# Patient Record
Sex: Male | Born: 1985 | Hispanic: No | Marital: Single
Health system: Southern US, Community
[De-identification: ages and names within clinical notes are randomized; demographics above are authoritative.]

---

## 2021-03-10 ENCOUNTER — Emergency Department (HOSPITAL_COMMUNITY)
Admission: EM | Admit: 2021-03-10 | Discharge: 2021-03-10 | Disposition: A | Discharge status: Home / Self Care | Payer: Self-pay | Attending: Emergency Medicine | Admitting: Emergency Medicine

## 2021-03-10 ENCOUNTER — Emergency Department (HOSPITAL_COMMUNITY): Payer: Self-pay

## 2021-03-10 ENCOUNTER — Other Ambulatory Visit: Payer: Self-pay

## 2021-03-10 DIAGNOSIS — S0993XA Unspecified injury of face, initial encounter: Secondary | ICD-10-CM | POA: Diagnosis present

## 2021-03-10 DIAGNOSIS — J439 Emphysema, unspecified: Secondary | ICD-10-CM | POA: Diagnosis not present

## 2021-03-10 DIAGNOSIS — R110 Nausea: Secondary | ICD-10-CM | POA: Insufficient documentation

## 2021-03-10 DIAGNOSIS — S1093XA Contusion of unspecified part of neck, initial encounter: Secondary | ICD-10-CM | POA: Diagnosis not present

## 2021-03-10 DIAGNOSIS — S01112A Laceration without foreign body of left eyelid and periocular area, initial encounter: Secondary | ICD-10-CM | POA: Diagnosis not present

## 2021-03-10 DIAGNOSIS — S40012A Contusion of left shoulder, initial encounter: Secondary | ICD-10-CM | POA: Insufficient documentation

## 2021-03-10 DIAGNOSIS — Y92410 Unspecified street and highway as the place of occurrence of the external cause: Secondary | ICD-10-CM | POA: Diagnosis not present

## 2021-03-10 DIAGNOSIS — S3991XA Unspecified injury of abdomen, initial encounter: Secondary | ICD-10-CM

## 2021-03-10 DIAGNOSIS — S299XXA Unspecified injury of thorax, initial encounter: Secondary | ICD-10-CM

## 2021-03-10 DIAGNOSIS — R1110 Vomiting, unspecified: Secondary | ICD-10-CM | POA: Diagnosis not present

## 2021-03-10 DIAGNOSIS — R519 Headache, unspecified: Secondary | ICD-10-CM | POA: Insufficient documentation

## 2021-03-10 DIAGNOSIS — S0990XA Unspecified injury of head, initial encounter: Secondary | ICD-10-CM | POA: Diagnosis not present

## 2021-03-10 DIAGNOSIS — Y900 Blood alcohol level of less than 20 mg/100 ml: Secondary | ICD-10-CM | POA: Insufficient documentation

## 2021-03-10 DIAGNOSIS — M546 Pain in thoracic spine: Secondary | ICD-10-CM | POA: Diagnosis not present

## 2021-03-10 DIAGNOSIS — S2232XA Fracture of one rib, left side, initial encounter for closed fracture: Secondary | ICD-10-CM

## 2021-03-10 DIAGNOSIS — M7918 Myalgia, other site: Secondary | ICD-10-CM

## 2021-03-10 LAB — SAMPLE TO BLOOD BANK

## 2021-03-10 LAB — COMPREHENSIVE METABOLIC PANEL
ALT: 22 U/L (ref 0–44)
AST: 31 U/L (ref 15–41)
Albumin: 4.3 g/dL (ref 3.5–5.0)
Alkaline Phosphatase: 57 U/L (ref 38–126)
Anion gap: 11 (ref 5–15)
BUN: 15 mg/dL (ref 6–20)
CO2: 26 mmol/L (ref 22–32)
Calcium: 9.5 mg/dL (ref 8.9–10.3)
Chloride: 105 mmol/L (ref 98–111)
Creatinine, Ser: 0.84 mg/dL (ref 0.61–1.24)
GFR, Estimated: >60 mL/min (ref >60)
Glucose, Bld: 99 mg/dL (ref 70–99)
Potassium: 3.4 mmol/L — ABNORMAL LOW (ref 3.5–5.1)
Sodium: 142 mmol/L (ref 135–145)
Total Bilirubin: 1 mg/dL (ref 0.3–1.2)
Total Protein: 7.1 g/dL (ref 6.5–8.1)

## 2021-03-10 LAB — I-STAT CHEM 8, ED
BUN: 17 mg/dL (ref 6–20)
Calcium, Ion: 1.18 mmol/L (ref 1.15–1.40)
Chloride: 106 mmol/L (ref 98–111)
Creatinine, Ser: 0.8 mg/dL (ref 0.61–1.24)
Glucose, Bld: 100 mg/dL — ABNORMAL HIGH (ref 70–99)
HCT: 42 % (ref 39.0–52.0)
Hemoglobin: 14.3 g/dL (ref 13.0–17.0)
Potassium: 3.3 mmol/L — ABNORMAL LOW (ref 3.5–5.1)
Sodium: 144 mmol/L (ref 135–145)
TCO2: 26 mmol/L (ref 22–32)

## 2021-03-10 LAB — CBC
HCT: 40.3 % (ref 39.0–52.0)
Hemoglobin: 13.4 g/dL (ref 13.0–17.0)
MCH: 31.3 pg (ref 26.0–34.0)
MCHC: 33.3 g/dL (ref 30.0–36.0)
MCV: 94.2 fL (ref 80.0–100.0)
Platelets: 213 10*3/uL (ref 150–400)
RBC: 4.28 MIL/uL (ref 4.22–5.81)
RDW: 13.3 % (ref 11.5–15.5)
WBC: 16.2 10*3/uL — ABNORMAL HIGH (ref 4.0–10.5)
nRBC: 0 % (ref 0.0–0.2)

## 2021-03-10 LAB — ETHANOL: Alcohol, Ethyl (B): <10 mg/dL (ref <10)

## 2021-03-10 LAB — LACTIC ACID, PLASMA: Lactic Acid, Venous: 0.7 mmol/L (ref 0.5–1.9)

## 2021-03-10 LAB — PROTIME-INR
INR: 1 (ref 0.8–1.2)
Prothrombin Time: 13.4 seconds (ref 11.4–15.2)

## 2021-03-10 MED ORDER — IBUPROFEN 600 MG PO TABS: 600.0000 mg | ORAL_TABLET | Freq: Four times a day (QID) | ORAL | 0 refills | Status: DC | PRN

## 2021-03-10 MED ORDER — CYCLOBENZAPRINE HCL 10 MG PO TABS: 10.0000 mg | ORAL_TABLET | Freq: Two times a day (BID) | ORAL | 0 refills | Status: DC | PRN

## 2021-03-10 MED ORDER — IOHEXOL 300 MG/ML  SOLN
100.0000 mL | Freq: Once | INTRAMUSCULAR | Status: AC | PRN
  Administered 2021-03-10: 100 mL via INTRAVENOUS

## 2021-03-10 MED ORDER — ONDANSETRON 4 MG PO TBDP
4.0000 mg | ORAL_TABLET | Freq: Once | ORAL | Status: AC
  Administered 2021-03-10: 4 mg via ORAL
  Filled 2021-03-10: qty 1

## 2021-03-10 MED ORDER — OXYCODONE-ACETAMINOPHEN 5-325 MG PO TABS: 1.0000 | ORAL_TABLET | Freq: Four times a day (QID) | ORAL | 0 refills | Status: DC | PRN

## 2021-03-10 MED ORDER — MORPHINE SULFATE (PF) 4 MG/ML IV SOLN
4.0000 mg | Freq: Once | INTRAVENOUS | Status: AC
  Administered 2021-03-10: 4 mg via INTRAVENOUS
  Filled 2021-03-10: qty 1

## 2021-03-10 NOTE — Discharge Instructions (Addendum)
Cool compresses to the sore areas. Take medications as prescribed.   Return to the ED with any new or concerning symptoms. Specifically, any sharp severe pain of the left side neck with any swelling.
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
## 2021-03-10 NOTE — ED Notes (Signed)
Patient transported to CT 
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
## 2021-03-10 NOTE — ED Notes (Signed)
Collar removed by provider

## 2022-03-01 ENCOUNTER — Emergency Department (HOSPITAL_COMMUNITY): Payer: Commercial Managed Care - PPO

## 2022-03-01 ENCOUNTER — Encounter (HOSPITAL_COMMUNITY): Payer: Self-pay

## 2022-03-01 ENCOUNTER — Emergency Department (HOSPITAL_COMMUNITY)
Admission: EM | Admit: 2022-03-01 | Discharge: 2022-03-01 | Disposition: A | Discharge status: Home / Self Care | Payer: Commercial Managed Care - PPO | Attending: Emergency Medicine | Admitting: Emergency Medicine

## 2022-03-01 DIAGNOSIS — Q676 Pectus excavatum: Secondary | ICD-10-CM | POA: Insufficient documentation

## 2022-03-01 DIAGNOSIS — R079 Chest pain, unspecified: Secondary | ICD-10-CM | POA: Diagnosis present

## 2022-03-01 DIAGNOSIS — R072 Precordial pain: Secondary | ICD-10-CM | POA: Insufficient documentation

## 2022-03-01 DIAGNOSIS — R9431 Abnormal electrocardiogram [ECG] [EKG]: Secondary | ICD-10-CM | POA: Insufficient documentation

## 2022-03-01 DIAGNOSIS — D72829 Elevated white blood cell count, unspecified: Secondary | ICD-10-CM | POA: Diagnosis not present

## 2022-03-01 LAB — COMPREHENSIVE METABOLIC PANEL
ALT: 16 U/L (ref 0–44)
AST: 22 U/L (ref 15–41)
Albumin: 4.5 g/dL (ref 3.5–5.0)
Alkaline Phosphatase: 54 U/L (ref 38–126)
Anion gap: 9 (ref 5–15)
BUN: 12 mg/dL (ref 6–20)
CO2: 24 mmol/L (ref 22–32)
Calcium: 9.8 mg/dL (ref 8.9–10.3)
Chloride: 107 mmol/L (ref 98–111)
Creatinine, Ser: 0.84 mg/dL (ref 0.61–1.24)
GFR, Estimated: >60 mL/min (ref >60)
Glucose, Bld: 89 mg/dL (ref 70–99)
Potassium: 3.9 mmol/L (ref 3.5–5.1)
Sodium: 140 mmol/L (ref 135–145)
Total Bilirubin: 1.9 mg/dL — ABNORMAL HIGH (ref 0.3–1.2)
Total Protein: 7.6 g/dL (ref 6.5–8.1)

## 2022-03-01 LAB — CBC
HCT: 44.3 % (ref 39.0–52.0)
Hemoglobin: 14.8 g/dL (ref 13.0–17.0)
MCH: 31.1 pg (ref 26.0–34.0)
MCHC: 33.4 g/dL (ref 30.0–36.0)
MCV: 93.1 fL (ref 80.0–100.0)
Platelets: 224 10*3/uL (ref 150–400)
RBC: 4.76 MIL/uL (ref 4.22–5.81)
RDW: 13.1 % (ref 11.5–15.5)
WBC: 11.1 10*3/uL — ABNORMAL HIGH (ref 4.0–10.5)
nRBC: 0 % (ref 0.0–0.2)

## 2022-03-01 LAB — TROPONIN I (HIGH SENSITIVITY): Troponin I (High Sensitivity): 2 ng/L (ref <18)

## 2022-03-01 LAB — LIPASE, BLOOD: Lipase: 26 U/L (ref 11–51)

## 2022-03-01 MED ORDER — MELOXICAM 7.5 MG PO TABS: 7.5000 mg | ORAL_TABLET | Freq: Every day | ORAL | 0 refills | Status: DC

## 2022-03-01 NOTE — ED Provider Notes (Signed)
[facility]-EMERGENCY DEPT Provider Note   CSN: [id number] Arrival date & time: 03/01/22  1248     History  Chief Complaint  Patient presents with   Chest Pain   Abdominal Pain    Michael Sawyer. is a 36 y.o. male.  Patient with history of pectus excavated him presents to the emergency department for evaluation of chest pains that have been occurring over the past 2 days.  Patient describes intermittent short-lived episodes of sharp stabbing pain in the left mid chest lasting for several minutes before resolving.  He states that the sensations are painful.  No associated nausea, vomiting, diaphoresis.  They were more frequent this morning, prompting emergency department visit.  He did feel lightheaded with a spell today, but no full syncope.  Symptoms are not triggered or worsened by eating or drinking.  Also not worsened by movement or palpation of the chest wall.  Patient states that he has been anxious and under a lot of stress.  He was encouraged by his family to get checked as well.  Patient denies risk factors for pulmonary embolism including: unilateral leg swelling, history of DVT/PE/other blood clots, use of exogenous hormones, recent immobilizations, recent surgery, recent travel (>4hr segment), malignancy, hemoptysis.  He did have a 3-hour flight to [place] in the end of June, but no subsequent leg pain or swelling.         Home Medications Prior to Admission medications   Medication Sig Start Date End Date Taking? Authorizing Provider  meloxicam (MOBIC) 7.5 MG tablet Take 1 tablet (7.5 mg total) by mouth daily. 03/01/22  Yes Renne Crigler, PA-C      Allergies    Penicillins    Review of Systems   Review of Systems  Physical Exam Updated Vital Signs BP (!) 128/98   Pulse 60   Temp 98 F (36.7 C) (Oral)   Resp 18   SpO2 98%  Physical Exam Vitals and nursing note reviewed.  Constitutional:      Appearance: He is well-developed. He is  not diaphoretic.  HENT:     Head: Normocephalic and atraumatic.     Mouth/Throat:     Mouth: Mucous membranes are not dry.  Eyes:     Conjunctiva/sclera: Conjunctivae normal.  Neck:     Vascular: Normal carotid pulses. No carotid bruit or JVD.     Trachea: Trachea normal. No tracheal deviation.  Cardiovascular:     Rate and Rhythm: Normal rate and regular rhythm.     Pulses: No decreased pulses.          Radial pulses are 2+ on the right side and 2+ on the left side.     Heart sounds: Normal heart sounds, S1 normal and S2 normal. Heart sounds not distant. No murmur heard. Pulmonary:     Effort: Pulmonary effort is normal. No respiratory distress.     Breath sounds: Normal breath sounds. No wheezing.  Chest:     Chest wall: No tenderness.     Comments: Pectus excavated him noted.  No tenderness to palpation over the chest or sternal areas.  No skin findings or rashes. Abdominal:     General: Bowel sounds are normal.     Palpations: Abdomen is soft.     Tenderness: There is no abdominal tenderness. There is no guarding or rebound.  Musculoskeletal:     Cervical back: Normal range of motion and neck supple. No muscular tenderness.     Right lower  leg: No edema.     Left lower leg: No edema.  Skin:    General: Skin is warm and dry.     Coloration: Skin is not pale.  Neurological:     Mental Status: He is alert. Mental status is at baseline.  Psychiatric:        Mood and Affect: Mood normal.     ED Results / Procedures / Treatments   Labs (all labs ordered are listed, but only abnormal results are displayed) Labs Reviewed  CBC - Abnormal; Notable for the following components:      Result Value   WBC 11.1 (*)    All other components within normal limits  COMPREHENSIVE METABOLIC PANEL - Abnormal; Notable for the following components:   Total Bilirubin 1.9 (*)    All other components within normal limits  LIPASE, BLOOD  TROPONIN I (HIGH SENSITIVITY)  TROPONIN I (HIGH  SENSITIVITY)    ED ECG REPORT   Date: 03/01/2022  Rate: 84  Rhythm: normal sinus rhythm  QRS Axis: right  Intervals: normal  ST/T Wave abnormalities: nonspecific T wave changes  Conduction Disutrbances:none  Narrative Interpretation:   Old EKG Reviewed: none available  I have personally reviewed the EKG tracing and agree with the computerized printout as noted.   Radiology DG Chest 2 View  Result Date: 03/01/2022 CLINICAL DATA:  chest pain and left epigastric pain EXAM: CHEST - 2 VIEW COMPARISON:  None Available. FINDINGS: The heart size and mediastinal contours are within normal limits. Both lungs are hyperinflated, clear and stable. The visualized skeletal structures are unremarkable. IMPRESSION: No active cardiopulmonary disease. Electronically Signed   By: Marjo Bicker M.D.   On: 03/01/2022 13:55    Procedures Procedures    Medications Ordered in ED Medications - No data to display  ED Course/ Medical Decision Making/ A&P    Patient seen and examined. History obtained directly from patient. Work-up including labs, imaging, EKG ordered in triage, if performed, were reviewed.    Labs/EKG: Independently reviewed and interpreted.  This included: CBC with minimally elevated white blood cell count at 11.1 otherwise unremarkable; CMP unremarkable; lipase normal; troponin 2; EKG interpreted as above.  Imaging: Independently reviewed and interpreted.  This included: Chest x-ray, agree negative.  Medications/Fluids: None ordered. Most recent vital signs reviewed and are as follows: BP (!) 128/98   Pulse 60   Temp 98 F (36.7 C) (Oral)   Resp 18   SpO2 98%   Initial impression: Atypical chest pain  Home treatment plan: Trial of meloxicam, rest.  Return and follow-up instructions: I encouraged patient to return to ED with severe chest pain, especially if the pain is crushing or pressure-like and spreads to the arms, back, neck, or jaw, or if they have associated sweating,  vomiting, or shortness of breath with the pain, or significant pain with activity. We discussed that the evaluation here today indicates a low-risk of serious cause of chest pain, including heart trouble or a blood clot, but no evaluation is perfect and chest pain can evolve with time. The patient verbalized understanding and agreed.  I encouraged patient to follow-up with their provider in the next 48 hours for recheck.  I have also provided a cardiology referral.                           Medical Decision Making Risk Prescription drug management.   For this patient's complaint of chest pain, the following  emergent conditions were considered on the differential diagnosis: acute coronary syndrome, pulmonary embolism, pneumothorax, myocarditis, pericardial tamponade, aortic dissection, thoracic aortic aneurysm complication, esophageal perforation.   Other causes were also considered including: gastroesophageal reflux disease, musculoskeletal pain including costochondritis, pneumonia/pleurisy, herpes zoster, pericarditis.  In regards to possibility of ACS, patient has atypical features of pain, non-ischemic and unchanged EKG and negative troponin(s). Heart score was calculated to be 1.   In regards to possibility of PE, symptoms are atypical for PE and patient is PERC negative and chance of PE < 2%.   The patient's vital signs, pertinent lab work and imaging were reviewed and interpreted as discussed in the ED course. Hospitalization was considered for further testing, treatments, or serial exams/observation. However as patient is well-appearing, has a stable exam, and reassuring studies today, I do not feel that they warrant admission at this time. This plan was discussed with the patient who verbalizes agreement and comfort with this plan and seems reliable and able to return to the Emergency Department with worsening or changing symptoms.          Final Clinical Impression(s) / ED  Diagnoses Final diagnoses:  Precordial pain  Abnormal EKG  Pectus excavatum    Rx / DC Orders ED Discharge Orders          Ordered    meloxicam (MOBIC) 7.5 MG tablet  Daily        03/01/22 1908    Ambulatory referral to Cardiology       Comments: If you have not heard from the Cardiology office within the next 72 hours please call [phone].   03/01/22 1908              Carlisle Cater, PA-C 03/01/22 1916    Drenda Freeze, MD 03/01/22 2328

## 2022-03-01 NOTE — ED Provider Triage Note (Signed)
Emergency Medicine Provider Triage Evaluation Note  Michael Sawyer. , a 36 y.o. male  was evaluated in triage.  Pt complains of chest pain. States that same has been ongoing for the past 2 days, is left sided in nature and does not radiate. He states that he has felt similar pain in the past and has been told it is due to his pectus excavatum. Denies any shortness of breath. Pain is not pleuritis or exertional. Denies any leg pain or leg swelling. Also endorses a few episodes of nausea and vomiting with the last being prior to arrival.  Review of Systems  Positive:  Negative:   Physical Exam  BP (!) 127/93 (BP Location: Left Arm)   Pulse 82   Temp 98.5 F (36.9 C) (Oral)   Resp 18   SpO2 100%  Gen:   Awake, no distress   Resp:  Normal effort  MSK:   Moves extremities without difficulty  Other:    Medical Decision Making  Medically screening exam initiated at 1:25 PM.  Appropriate orders placed.  Michael Quest. was informed that the remainder of the evaluation will be completed by another provider, this initial triage assessment does not replace that evaluation, and the importance of remaining in the ED until their evaluation is complete.     Silva Bandy, PA-C 03/01/22 1327

## 2022-03-01 NOTE — Discharge Instructions (Signed)
Please read and follow all provided instructions.  Your diagnoses today include:  1. Precordial pain   2. Abnormal EKG   3. Pectus excavatum     Tests performed today include: An EKG of your heart A chest x-ray Cardiac enzymes - a blood test for heart muscle damage Blood counts and electrolytes Vital signs. See below for your results today.   Medications prescribed:  Meloxicam - anti-inflammatory pain medication  You have been prescribed an anti-inflammatory medication or NSAID. Take with food. Do not take aspirin, ibuprofen, or naproxen if taking this medication. Take smallest effective dose for the shortest duration needed for your pain. Stop taking if you experience stomach pain or vomiting.   Take any prescribed medications only as directed.  Follow-up instructions: Please follow-up with your primary care provider or the cardiology referral for further evaluation of your symptoms.   Return instructions:  SEEK IMMEDIATE MEDICAL ATTENTION IF: You have severe chest pain, especially if the pain is crushing or pressure-like and spreads to the arms, back, neck, or jaw, or if you have sweating, nausea or vomiting, or trouble with breathing. THIS IS AN EMERGENCY. Do not wait to see if the pain will go away. Get medical help at once. Call 911. DO NOT drive yourself to the hospital.  Your chest pain gets worse and does not go away after a few minutes of rest.  You have an attack of chest pain lasting longer than what you usually experience.  You have significant dizziness, if you pass out, or have trouble walking.  You have chest pain not typical of your usual pain for which you originally saw your caregiver.  You have any other emergent concerns regarding your health.  Additional Information: Chest pain comes from many different causes. Your caregiver has diagnosed you as having chest pain that is not specific for one problem, but does not require admission.  You are at low risk for an  acute heart condition or other serious illness.   Your vital signs today were: BP (!) 128/98   Pulse 60   Temp 98 F (36.7 C) (Oral)   Resp 18   SpO2 98%  If your blood pressure (BP) was elevated above 135/85 this visit, please have this repeated by your doctor within one month. --------------

## 2022-03-01 NOTE — ED Triage Notes (Signed)
Pt states he began having L sided Cp and epigastric pain yesterday. Pt also c/o N/V.

## 2022-03-07 ENCOUNTER — Ambulatory Visit (INDEPENDENT_AMBULATORY_CARE_PROVIDER_SITE_OTHER): Payer: Commercial Managed Care - PPO | Admitting: Internal Medicine

## 2022-03-07 VITALS — BP 104/72 | HR 68 | Ht 73.0 in | Wt 143.0 lb

## 2022-03-07 DIAGNOSIS — R9431 Abnormal electrocardiogram [ECG] [EKG]: Secondary | ICD-10-CM

## 2022-03-07 NOTE — Patient Instructions (Signed)
Medication Instructions:  No Changes In Medications at this time.  *If you need a refill on your cardiac medications before your next appointment, please call your pharmacy*  Lab Work: None Ordered At This Time.  If you have labs (blood work) drawn today and your tests are completely normal, you will receive your results only by: MyChart Message (if you have MyChart) OR A paper copy in the mail If you have any lab test that is abnormal or we need to change your treatment, we will call you to review the results.  Testing/Procedures: Your physician has requested that you have an echocardiogram. Echocardiography is a painless test that uses sound waves to create images of your heart. It provides your doctor with information about the size and shape of your heart and how well your heart's chambers and valves are working. You may receive an ultrasound enhancing agent through an IV if needed to better visualize your heart during the echo.This procedure takes approximately one hour. There are no restrictions for this procedure. This will take place at the [address].   Follow-Up: At [facility], you and your health needs are our priority.  As part of our continuing mission to provide you with exceptional heart care, we have created designated Provider Care Teams.  These Care Teams include your primary Cardiologist (physician) and Advanced Practice Providers (APPs -  Physician Assistants and Nurse Practitioners) who all work together to provide you with the care you need, when you need it.  Your next appointment:   6 month(s)  The format for your next appointment:   In Person  Provider:   Maisie Fus, MD

## 2022-03-07 NOTE — Progress Notes (Signed)
Cardiology Office Note:    Date:  03/07/2022   ID:  Michael Quest., DOB 01/28/1986, MRN [id number]  PCP:  Aviva Kluver   [facility] Providers Cardiologist:  Maisie Fus, MD     Referring MD: Renne Crigler, PA-C   No chief complaint on file. Pleuritic CP  History of Present Illness:    Michael Soth. is a 36 y.o. male with a hx of pectus excavatum, he went to the ED a few days ago and noted sharp left chest pain that lasted for several minutes.  He has been anxious and has had a lot of stress. EKG shows RVH.  He states he feels a tightness. It felt heavy.  He states this has been lifelong.    Current Medications: Current Meds  Medication Sig   meloxicam (MOBIC) 7.5 MG tablet Take 1 tablet (7.5 mg total) by mouth daily.     Allergies:   Penicillins   Social History   Socioeconomic History   Marital status: Single    Spouse name: Not on file   Number of children: Not on file   Years of education: Not on file   Highest education level: Not on file  Occupational History   Not on file  Tobacco Use   Smoking status: Not on file   Smokeless tobacco: Not on file  Substance and Sexual Activity   Alcohol use: Not on file   Drug use: Not on file   Sexual activity: Not on file  Other Topics Concern   Not on file  Social History Narrative   Not on file   Social Determinants of Health   Financial Resource Strain: Not on file  Food Insecurity: Not on file  Transportation Needs: Not on file  Physical Activity: Not on file  Stress: Not on file  Social Connections: Not on file     Family History: GF with heart disease  ROS:   Please see the history of present illness.     All other systems reviewed and are negative.  EKGs/Labs/Other Studies Reviewed:    The following studies were reviewed today:   EKG:  EKG is  ordered today.  The ekg ordered today demonstrates   03/07/2022-NSR, RVH  Recent Labs: 03/01/2022: ALT 16; BUN 12; Creatinine, Ser 0.84;  Hemoglobin 14.8; Platelets 224; Potassium 3.9; Sodium 140   Recent Lipid Panel No results found for: "CHOL", "TRIG", "HDL", "CHOLHDL", "VLDL", "LDLCALC", "LDLDIRECT"   Risk Assessment/Calculations:           Physical Exam:    VS:  BP 104/72   Pulse 68   Ht 6\' 1"  (1.854 m)   Wt 143 lb (64.9 kg)   SpO2 98%   BMI 18.87 kg/m     Wt Readings from Last 3 Encounters:  03/07/22 143 lb (64.9 kg)     GEN:  Well nourished, well developed in no acute distress HEENT: Normal NECK: No JVD; No carotid bruits LYMPHATICS: No lymphadenopathy CARDIAC: RRR, no murmurs, rubs, gallops RESPIRATORY:  Clear to auscultation without rales, wheezing or rhonchi  ABDOMEN: Soft, non-tender, non-distended MUSCULOSKELETAL:  pectus excavatum SKIN: Warm and dry NEUROLOGIC:  Alert and oriented x 3 PSYCHIATRIC:  Normal affect   ASSESSMENT:    Pleuritic CP:  No signs of PE/DVT. Will plan for TTE to considering pectus is associated with congential heart dx. With RVH can assess for PS. PLAN:    In order of problems listed above:  TTE Follow up 6 months  Medication Adjustments/Labs and Tests Ordered: Current medicines are reviewed at length with the patient today.  Concerns regarding medicines are outlined above.  Orders Placed This Encounter  Procedures   EKG 12-Lead   ECHOCARDIOGRAM COMPLETE   No orders of the defined types were placed in this encounter.   Patient Instructions  Medication Instructions:  No Changes In Medications at this time.  *If you need a refill on your cardiac medications before your next appointment, please call your pharmacy*  Lab Work: None Ordered At This Time.  If you have labs (blood work) drawn today and your tests are completely normal, you will receive your results only by: MyChart Message (if you have MyChart) OR A paper copy in the mail If you have any lab test that is abnormal or we need to change your treatment, we will call you to review the  results.  Testing/Procedures: Your physician has requested that you have an echocardiogram. Echocardiography is a painless test that uses sound waves to create images of your heart. It provides your doctor with information about the size and shape of your heart and how well your heart's chambers and valves are working. You may receive an ultrasound enhancing agent through an IV if needed to better visualize your heart during the echo.This procedure takes approximately one hour. There are no restrictions for this procedure. This will take place at the [address].   Follow-Up: At [facility], you and your health needs are our priority.  As part of our continuing mission to provide you with exceptional heart care, we have created designated Provider Care Teams.  These Care Teams include your primary Cardiologist (physician) and Advanced Practice Providers (APPs -  Physician Assistants and Nurse Practitioners) who all work together to provide you with the care you need, when you need it.  Your next appointment:   6 month(s)  The format for your next appointment:   In Person  Provider:   Maisie Fus, MD        Signed, Maisie Fus, MD  03/07/2022 11:52 AM    [facility]

## 2022-03-18 ENCOUNTER — Encounter (HOSPITAL_COMMUNITY): Payer: Self-pay | Admitting: Emergency Medicine

## 2022-03-18 ENCOUNTER — Emergency Department (HOSPITAL_COMMUNITY)
Admission: EM | Admit: 2022-03-18 | Discharge: 2022-03-18 | Disposition: A | Discharge status: Home / Self Care | Payer: Commercial Managed Care - PPO | Attending: Emergency Medicine | Admitting: Emergency Medicine

## 2022-03-18 ENCOUNTER — Emergency Department (HOSPITAL_COMMUNITY): Payer: Commercial Managed Care - PPO

## 2022-03-18 DIAGNOSIS — S0181XA Laceration without foreign body of other part of head, initial encounter: Secondary | ICD-10-CM

## 2022-03-18 DIAGNOSIS — R519 Headache, unspecified: Secondary | ICD-10-CM | POA: Diagnosis not present

## 2022-03-18 DIAGNOSIS — S01112A Laceration without foreign body of left eyelid and periocular area, initial encounter: Secondary | ICD-10-CM | POA: Insufficient documentation

## 2022-03-18 DIAGNOSIS — S0993XA Unspecified injury of face, initial encounter: Secondary | ICD-10-CM | POA: Diagnosis present

## 2022-03-18 MED ORDER — BACITRACIN ZINC 500 UNIT/GM EX OINT: TOPICAL_OINTMENT | Freq: Two times a day (BID) | CUTANEOUS | Status: DC

## 2022-03-18 MED ORDER — LIDOCAINE-EPINEPHRINE (PF) 2 %-1:200000 IJ SOLN
10.0000 mL | Freq: Once | INTRAMUSCULAR | Status: AC
  Administered 2022-03-18: 10 mL via INTRADERMAL
  Filled 2022-03-18: qty 20

## 2022-03-18 NOTE — ED Notes (Signed)
Pt ambulated to CT
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
## 2022-03-24 ENCOUNTER — Ambulatory Visit (HOSPITAL_COMMUNITY): Payer: Commercial Managed Care - PPO | Attending: Internal Medicine

## 2022-03-24 DIAGNOSIS — R9431 Abnormal electrocardiogram [ECG] [EKG]: Secondary | ICD-10-CM | POA: Diagnosis present

## 2022-03-24 LAB — ECHOCARDIOGRAM COMPLETE
Area-P 1/2: 3.91 cm2
S' Lateral: 3.5 cm

## 2022-04-26 ENCOUNTER — Other Ambulatory Visit: Payer: Self-pay

## 2022-04-26 ENCOUNTER — Emergency Department (HOSPITAL_COMMUNITY): Payer: Commercial Managed Care - PPO

## 2022-04-26 ENCOUNTER — Emergency Department (HOSPITAL_COMMUNITY)
Admission: EM | Admit: 2022-04-26 | Discharge: 2022-04-26 | Disposition: A | Discharge status: Home / Self Care | Payer: Commercial Managed Care - PPO | Attending: Emergency Medicine | Admitting: Emergency Medicine

## 2022-04-26 ENCOUNTER — Encounter (HOSPITAL_COMMUNITY): Payer: Self-pay

## 2022-04-26 DIAGNOSIS — M542 Cervicalgia: Secondary | ICD-10-CM | POA: Insufficient documentation

## 2022-04-26 DIAGNOSIS — M79631 Pain in right forearm: Secondary | ICD-10-CM | POA: Insufficient documentation

## 2022-04-26 DIAGNOSIS — S060X0A Concussion without loss of consciousness, initial encounter: Secondary | ICD-10-CM

## 2022-04-26 DIAGNOSIS — M25562 Pain in left knee: Secondary | ICD-10-CM | POA: Diagnosis not present

## 2022-04-26 DIAGNOSIS — M62830 Muscle spasm of back: Secondary | ICD-10-CM | POA: Diagnosis not present

## 2022-04-26 DIAGNOSIS — Y92410 Unspecified street and highway as the place of occurrence of the external cause: Secondary | ICD-10-CM | POA: Diagnosis not present

## 2022-04-26 DIAGNOSIS — M25462 Effusion, left knee: Secondary | ICD-10-CM | POA: Insufficient documentation

## 2022-04-26 DIAGNOSIS — R0789 Other chest pain: Secondary | ICD-10-CM | POA: Diagnosis not present

## 2022-04-26 DIAGNOSIS — S0990XA Unspecified injury of head, initial encounter: Secondary | ICD-10-CM | POA: Diagnosis present

## 2022-04-26 DIAGNOSIS — S0083XA Contusion of other part of head, initial encounter: Secondary | ICD-10-CM | POA: Diagnosis not present

## 2022-04-26 LAB — ETHANOL: Alcohol, Ethyl (B): <10 mg/dL (ref <10)

## 2022-04-26 LAB — COMPREHENSIVE METABOLIC PANEL
ALT: 20 U/L (ref 0–44)
AST: 24 U/L (ref 15–41)
Albumin: 4.2 g/dL (ref 3.5–5.0)
Alkaline Phosphatase: 58 U/L (ref 38–126)
Anion gap: 7 (ref 5–15)
BUN: 13 mg/dL (ref 6–20)
CO2: 25 mmol/L (ref 22–32)
Calcium: 8.7 mg/dL — ABNORMAL LOW (ref 8.9–10.3)
Chloride: 109 mmol/L (ref 98–111)
Creatinine, Ser: 0.63 mg/dL (ref 0.61–1.24)
GFR, Estimated: >60 mL/min (ref >60)
Glucose, Bld: 90 mg/dL (ref 70–99)
Potassium: 3.8 mmol/L (ref 3.5–5.1)
Sodium: 141 mmol/L (ref 135–145)
Total Bilirubin: 0.5 mg/dL (ref 0.3–1.2)
Total Protein: 7.2 g/dL (ref 6.5–8.1)

## 2022-04-26 LAB — CBC
HCT: 39.7 % (ref 39.0–52.0)
Hemoglobin: 13.3 g/dL (ref 13.0–17.0)
MCH: 31.5 pg (ref 26.0–34.0)
MCHC: 33.5 g/dL (ref 30.0–36.0)
MCV: 94.1 fL (ref 80.0–100.0)
Platelets: 219 10*3/uL (ref 150–400)
RBC: 4.22 MIL/uL (ref 4.22–5.81)
RDW: 13.2 % (ref 11.5–15.5)
WBC: 15.8 10*3/uL — ABNORMAL HIGH (ref 4.0–10.5)
nRBC: 0 % (ref 0.0–0.2)

## 2022-04-26 LAB — PROTIME-INR
INR: 1 (ref 0.8–1.2)
Prothrombin Time: 12.8 seconds (ref 11.4–15.2)

## 2022-04-26 LAB — URINALYSIS, ROUTINE W REFLEX MICROSCOPIC
Bilirubin Urine: NEGATIVE
Glucose, UA: NEGATIVE mg/dL
Hgb urine dipstick: NEGATIVE
Ketones, ur: NEGATIVE mg/dL
Leukocytes,Ua: NEGATIVE
Nitrite: NEGATIVE
Protein, ur: NEGATIVE mg/dL
Specific Gravity, Urine: 1.011 (ref 1.005–1.030)
pH: 5 (ref 5.0–8.0)

## 2022-04-26 LAB — RAPID URINE DRUG SCREEN, HOSP PERFORMED
Amphetamines: NOT DETECTED
Barbiturates: NOT DETECTED
Benzodiazepines: NOT DETECTED
Cocaine: NOT DETECTED
Opiates: NOT DETECTED
Tetrahydrocannabinol: POSITIVE — AB

## 2022-04-26 LAB — SAMPLE TO BLOOD BANK

## 2022-04-26 MED ORDER — PROCHLORPERAZINE EDISYLATE 10 MG/2ML IJ SOLN
5.0000 mg | Freq: Once | INTRAMUSCULAR | Status: AC
  Administered 2022-04-26: 5 mg via INTRAVENOUS
  Filled 2022-04-26: qty 2

## 2022-04-26 MED ORDER — SODIUM CHLORIDE (PF) 0.9 % IJ SOLN
INTRAMUSCULAR | Status: AC
  Filled 2022-04-26: qty 50

## 2022-04-26 MED ORDER — KETOROLAC TROMETHAMINE 30 MG/ML IJ SOLN
30.0000 mg | Freq: Once | INTRAMUSCULAR | Status: AC
  Administered 2022-04-26: 30 mg via INTRAVENOUS
  Filled 2022-04-26: qty 1

## 2022-04-26 MED ORDER — IOHEXOL 300 MG/ML  SOLN
100.0000 mL | Freq: Once | INTRAMUSCULAR | Status: AC | PRN
  Administered 2022-04-26: 100 mL via INTRAVENOUS

## 2022-04-26 MED ORDER — NAPROXEN 375 MG PO TABS
375.0000 mg | ORAL_TABLET | Freq: Two times a day (BID) | ORAL | 0 refills | Status: DC
Start: 2022-04-26 — End: 2023-03-09

## 2022-04-26 MED ORDER — MECLIZINE HCL 25 MG PO TABS
25.0000 mg | ORAL_TABLET | Freq: Three times a day (TID) | ORAL | 0 refills | Status: AC | PRN
Start: 2022-04-26 — End: ?
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
## 2022-05-19 ENCOUNTER — Other Ambulatory Visit: Payer: Self-pay

## 2022-05-19 ENCOUNTER — Emergency Department (HOSPITAL_COMMUNITY)
Admission: EM | Admit: 2022-05-19 | Discharge: 2022-05-19 | Disposition: A | Discharge status: Home / Self Care | Payer: Commercial Managed Care - PPO | Attending: Emergency Medicine | Admitting: Emergency Medicine

## 2022-05-19 ENCOUNTER — Emergency Department (HOSPITAL_COMMUNITY): Payer: Commercial Managed Care - PPO

## 2022-05-19 DIAGNOSIS — R109 Unspecified abdominal pain: Secondary | ICD-10-CM | POA: Diagnosis not present

## 2022-05-19 DIAGNOSIS — M549 Dorsalgia, unspecified: Secondary | ICD-10-CM | POA: Insufficient documentation

## 2022-05-19 MED ORDER — ACETAMINOPHEN 500 MG PO TABS
1000.0000 mg | ORAL_TABLET | Freq: Once | ORAL | Status: AC
  Administered 2022-05-19: 1000 mg via ORAL
  Filled 2022-05-19: qty 2

## 2022-05-19 MED ORDER — IBUPROFEN 600 MG PO TABS: 600.0000 mg | ORAL_TABLET | Freq: Four times a day (QID) | ORAL | 0 refills | Status: AC | PRN
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
## 2022-05-19 NOTE — Discharge Instructions (Addendum)
We evaluated you for your back pain.  Your chest x-ray did not show a collapsed lung or pneumonia. Please take Tylenol and Motrin for your symptoms at home.  You can take 650 mg of Tylenol every 6 hours and 600 mg of ibuprofen every 6 hours as needed for your symptoms.  You can take these medicines together as needed, either at the same time, or alternating every 3 hours.   Please return to the emergency department if you develop any new or worsening symptoms, such as trouble breathing, fainting, lightheadedness or dizziness, worsening pain, or any other new symptoms.

## 2022-07-25 ENCOUNTER — Emergency Department (HOSPITAL_BASED_OUTPATIENT_CLINIC_OR_DEPARTMENT_OTHER)
Admission: EM | Admit: 2022-07-25 | Discharge: 2022-07-25 | Disposition: A | Discharge status: Home / Self Care | Payer: Commercial Managed Care - PPO | Attending: Emergency Medicine | Admitting: Emergency Medicine

## 2022-07-25 ENCOUNTER — Other Ambulatory Visit: Payer: Self-pay

## 2022-07-25 ENCOUNTER — Encounter (HOSPITAL_BASED_OUTPATIENT_CLINIC_OR_DEPARTMENT_OTHER): Payer: Self-pay | Admitting: Emergency Medicine

## 2022-07-25 DIAGNOSIS — R369 Urethral discharge, unspecified: Secondary | ICD-10-CM | POA: Diagnosis present

## 2022-07-25 DIAGNOSIS — Z202 Contact with and (suspected) exposure to infections with a predominantly sexual mode of transmission: Secondary | ICD-10-CM | POA: Diagnosis not present

## 2022-07-25 DIAGNOSIS — R300 Dysuria: Secondary | ICD-10-CM

## 2022-07-25 LAB — URINALYSIS, ROUTINE W REFLEX MICROSCOPIC
Bilirubin Urine: NEGATIVE
Glucose, UA: NEGATIVE mg/dL
Hgb urine dipstick: NEGATIVE
Ketones, ur: NEGATIVE mg/dL
Nitrite: NEGATIVE
Protein, ur: NEGATIVE mg/dL
Specific Gravity, Urine: 1.02 (ref 1.005–1.030)
WBC, UA: >50 WBC/hpf — ABNORMAL HIGH (ref 0–5)
pH: 7.5 (ref 5.0–8.0)

## 2022-07-25 LAB — HIV ANTIBODY (ROUTINE TESTING W REFLEX): HIV Screen 4th Generation wRfx: NONREACTIVE

## 2022-07-25 MED ORDER — DOXYCYCLINE HYCLATE 100 MG PO CAPS: 100.0000 mg | ORAL_CAPSULE | Freq: Two times a day (BID) | ORAL | 0 refills | Status: DC

## 2022-07-25 MED ORDER — CEFTRIAXONE SODIUM 500 MG IJ SOLR
500.0000 mg | Freq: Once | INTRAMUSCULAR | Status: AC
  Administered 2022-07-25: 500 mg via INTRAMUSCULAR
  Filled 2022-07-25: qty 500

## 2022-07-25 MED ORDER — DOXYCYCLINE HYCLATE 100 MG PO TABS
100.0000 mg | ORAL_TABLET | Freq: Once | ORAL | Status: AC
  Administered 2022-07-25: 100 mg via ORAL
  Filled 2022-07-25: qty 1

## 2022-07-25 NOTE — ED Triage Notes (Signed)
Pt arrives to ED with c/o penile discharge and dysuria x2 days.

## 2022-07-25 NOTE — ED Provider Notes (Signed)
[facility] EMERGENCY DEPT Provider Note   CSN: [id number] Arrival date & time: 07/25/22  1405     History  Chief Complaint  Patient presents with   Penile Discharge    Michael Sawyer. is a 36 y.o. male.   Penile Discharge   36 year old male presents emergency department complaints of penile discharge and dysuria for the past 2 to 3 days.  Reports recent new sexual partner and is concerned about possible STD transmission of which she is requesting to be tested for everything.  Denies any testicular pain, noticeable rash/sore.  He states he only has pain when urinating.  He notices white, cloudy like discharge that has been present since symptom onset.  Denies fever, chills, night sweats, chest pain, shortness of breath, abdominal pain, nausea, vomiting, change in bowel habits.  No significant past medical history. Home Medications Prior to Admission medications   Medication Sig Start Date End Date Taking? Authorizing Provider  doxycycline (VIBRAMYCIN) 100 MG capsule Take 1 capsule (100 mg total) by mouth 2 (two) times daily. 07/25/22  Yes Dion Saucier A, PA  ibuprofen (ADVIL) 600 MG tablet Take 1 tablet (600 mg total) by mouth every 6 (six) hours as needed. 05/19/22   Cristie Hem, MD  meclizine (ANTIVERT) 25 MG tablet Take 1 tablet (25 mg total) by mouth 3 (three) times daily as needed for dizziness. 04/26/22   Margarita Mail, PA-C  meloxicam (MOBIC) 7.5 MG tablet Take 1 tablet (7.5 mg total) by mouth daily. Patient not taking: Reported on 04/26/2022 03/01/22   Carlisle Cater, PA-C  naproxen (NAPROSYN) 375 MG tablet Take 1 tablet (375 mg total) by mouth 2 (two) times daily with a meal. 04/26/22   Margarita Mail, PA-C      Allergies    Penicillins    Review of Systems   Review of Systems  Genitourinary:  Positive for penile discharge.  All other systems reviewed and are negative.   Physical Exam Updated Vital Signs BP 125/85 (BP Location: Right Arm)    Pulse 80   Temp 98.2 F (36.8 C) (Oral)   Resp 16   Ht 6\' 1"  (1.854 m)   Wt 64.4 kg   SpO2 99%   BMI 18.73 kg/m  Physical Exam Vitals and nursing note reviewed. Exam conducted with a chaperone present.  Constitutional:      General: He is not in acute distress.    Appearance: He is well-developed.  HENT:     Head: Normocephalic and atraumatic.  Eyes:     Conjunctiva/sclera: Conjunctivae normal.  Cardiovascular:     Rate and Rhythm: Normal rate and regular rhythm.     Heart sounds: No murmur heard. Pulmonary:     Effort: Pulmonary effort is normal. No respiratory distress.     Breath sounds: Normal breath sounds.  Abdominal:     Palpations: Abdomen is soft.     Tenderness: There is no abdominal tenderness.  Genitourinary:    Pubic Area: No rash.      Penis: Circumcised. Discharge present.      Testes: Normal.        Right: Mass, tenderness or swelling not present.        Left: Mass, tenderness or swelling not present.     Epididymis:     Right: Normal.     Left: Normal.     Comments: White/cloudy discharge noted from ureteral meatus. Musculoskeletal:        General: No swelling.     Cervical  back: Neck supple.  Skin:    General: Skin is warm and dry.     Capillary Refill: Capillary refill takes less than 2 seconds.  Neurological:     Mental Status: He is alert.  Psychiatric:        Mood and Affect: Mood normal.     ED Results / Procedures / Treatments   Labs (all labs ordered are listed, but only abnormal results are displayed) Labs Reviewed  URINALYSIS, ROUTINE W REFLEX MICROSCOPIC - Abnormal; Notable for the following components:      Result Value   APPearance HAZY (*)    Leukocytes,Ua LARGE (*)    WBC, UA >50 (*)    Bacteria, UA RARE (*)    All other components within normal limits  RPR  HIV ANTIBODY (ROUTINE TESTING W REFLEX)  GC/CHLAMYDIA PROBE AMP ([facility]) NOT AT [facility]    EKG None  Radiology No results found.  Procedures Procedures     Medications Ordered in ED Medications  doxycycline (VIBRA-TABS) tablet 100 mg (has no administration in time range)  cefTRIAXone (ROCEPHIN) injection 500 mg (has no administration in time range)    ED Course/ Medical Decision Making/ A&P                           Medical Decision Making Amount and/or Complexity of Data Reviewed Labs: ordered.  Risk Prescription drug management.   This patient presents to the ED for concern of penile discharge/dysuria, this involves an extensive number of treatment options, and is a complaint that carries with it a high risk of complications and morbidity.  The differential diagnosis includes urinary tract infection, pyelonephritis, epididymitis, Fitz-Hugh Curtis syndrome, gonorrhea, chlamydia, syphilis, HIV   Co morbidities that complicate the patient evaluation  See HPI   Additional history obtained:  Additional history obtained from EMR External records from outside source obtained and reviewed including hospital records   Lab Tests:  I Ordered, and personally interpreted labs.  The pertinent results include: HIV, RPR pending.  GC chlamydia pending.  UA significant for large leukocyte, greater than 50 WBC with rare bacteria..   Imaging Studies ordered:  N/a   Cardiac Monitoring: / EKG:  The patient was maintained on a cardiac monitor.  I personally viewed and interpreted the cardiac monitored which showed an underlying rhythm of: Sinus rhythm   Consultations Obtained:  N/a   Problem List / ED Course / Critical interventions / Medication management  STD exposure/dysuria I ordered medication including Rocephin and doxycycline for empiric STD antibiotic coverage.   Reevaluation of the patient after these medicines showed that the patient stayed the same I have reviewed the patients home medicines and have made adjustments as needed   Social Determinants of Health:  Denies illicit drug use.   Test / Admission -  Considered:  STD exposure/dysuria Vitals signs within normal range and stable throughout visit. Laboratory/imaging studies significant for: See above Given patient's history of present illness as well as physical exam findings, most concerning for chlamydial/gonorrhea urethritis.  Treated empirically with Rocephin as well as doxycycline while in the emergency department.  Continue treatment with doxycycline to be administered outpatient.  Patient recommended to follow MyChart for results of RPR and HIV tests as well as follow-up with primary care in 5 to 7 days.  He is also educated regarding sexual abstinence until antibiotic course has completed as well as to alert sexual partners of presumed diagnosis so they can  seek treatment themselves..  Treatment plan discussed length with patient and he is not understanding was agreeable to said plan. Worrisome signs and symptoms were discussed with the patient, and the patient acknowledged understanding to return to the ED if noticed. Patient was stable upon discharge.          Final Clinical Impression(s) / ED Diagnoses Final diagnoses:  STD exposure  Dysuria    Rx / DC Orders ED Discharge Orders          Ordered    doxycycline (VIBRAMYCIN) 100 MG capsule  2 times daily        07/25/22 1528              Wilnette Kales, [place] 07/25/22 1532    Elgie Congo, MD 07/26/22 1223

## 2022-07-25 NOTE — ED Notes (Signed)
Patient verbalizes understanding of discharge instructions. Opportunity for questioning and answers were provided. Patient discharged from ED.  °

## 2022-07-25 NOTE — Discharge Instructions (Signed)
Noted to be treated today empirically for both gonorrhea and chlamydia.  For continue chlamydia coverage, continue take antibiotic pill as directed for the next 7 days.  Given your current symptoms, most likely infection of one of the other.  We have also tested for HIV as well as syphilis.  Follow results on MyChart.  Recommendation is to abstain from sexual intercourse until antibiotic therapy has been completed.  Also it would be in your partner's best interest to let them know of your current visit so they can seek treatment as well.  Follow-up with primary care recommended in 5 to 7 days.  Please not hesitate to return to emergency department for worrisome signs and symptoms we discussed become apparent.

## 2022-07-26 LAB — GC/CHLAMYDIA PROBE AMP (~~LOC~~) NOT AT ARMC
Chlamydia: NEGATIVE
Comment: NEGATIVE
Comment: NORMAL
Neisseria Gonorrhea: POSITIVE — AB

## 2022-07-26 LAB — RPR: RPR Ser Ql: NONREACTIVE

## 2022-08-25 ENCOUNTER — Ambulatory Visit: Payer: Commercial Managed Care - PPO | Attending: Internal Medicine | Admitting: Internal Medicine

## 2023-03-09 ENCOUNTER — Emergency Department (HOSPITAL_COMMUNITY)
Admission: EM | Admit: 2023-03-09 | Discharge: 2023-03-09 | Disposition: A | Discharge status: Home / Self Care | Payer: Commercial Managed Care - PPO | Attending: Emergency Medicine | Admitting: Emergency Medicine

## 2023-03-09 ENCOUNTER — Encounter (HOSPITAL_COMMUNITY): Payer: Self-pay | Admitting: Emergency Medicine

## 2023-03-09 DIAGNOSIS — K0889 Other specified disorders of teeth and supporting structures: Secondary | ICD-10-CM | POA: Diagnosis present

## 2023-03-09 DIAGNOSIS — K047 Periapical abscess without sinus: Secondary | ICD-10-CM | POA: Diagnosis not present

## 2023-03-09 MED ORDER — AMOXICILLIN-POT CLAVULANATE 875-125 MG PO TABS
1.0000 | ORAL_TABLET | Freq: Once | ORAL | Status: AC
  Administered 2023-03-09: 1 via ORAL

## 2023-03-09 MED ORDER — NAPROXEN 375 MG PO TABS: 375.0000 mg | ORAL_TABLET | Freq: Two times a day (BID) | ORAL | 0 refills | Status: AC

## 2023-03-09 MED ORDER — AMOXICILLIN-POT CLAVULANATE 875-125 MG PO TABS: 1.0000 | ORAL_TABLET | Freq: Two times a day (BID) | ORAL | 0 refills | Status: AC

## 2023-03-09 NOTE — ED Triage Notes (Signed)
Pt has swelling x 2 days to lower R tooth/jaw. Denies pain and has tried to call his dentist at [facility]. Took some amoxicillin friend had.

## 2023-03-09 NOTE — ED Provider Notes (Signed)
[place] EMERGENCY DEPARTMENT AT [facility] Provider Note  CSN: [id number] Arrival date & time: 03/09/23 1191  Chief Complaint(s) Oral Swelling  HPI Michael Sawyer. is a 37 y.o. male    The history is provided by the patient.  Dental Pain Location:  Lower Quality:  Constant Severity:  Moderate Onset quality:  Gradual Duration:  2 days Timing:  Constant Progression:  Worsening Chronicity:  New Context: abscess, dental fracture and poor dentition   Associated symptoms: facial pain and gum swelling   Associated symptoms: no drooling, no fever, no neck swelling and no oral lesions    Started taking a friend's amoxicillin for the last 2 days.  Past Medical History History reviewed. No pertinent past medical history. There are no problems to display for this patient.  Home Medication(s) Prior to Admission medications   Medication Sig Start Date End Date Taking? Authorizing Provider  amoxicillin-clavulanate (AUGMENTIN) 875-125 MG tablet Take 1 tablet by mouth every 12 (twelve) hours for 10 days. 03/09/23 03/19/23 Yes Erol Flanagin, Amadeo Garnet, MD  ibuprofen (ADVIL) 600 MG tablet Take 1 tablet (600 mg total) by mouth every 6 (six) hours as needed. 05/19/22   Lonell Grandchild, MD  meclizine (ANTIVERT) 25 MG tablet Take 1 tablet (25 mg total) by mouth 3 (three) times daily as needed for dizziness. 04/26/22   Arthor Captain, PA-C  naproxen (NAPROSYN) 375 MG tablet Take 1 tablet (375 mg total) by mouth 2 (two) times daily with a meal. 03/09/23   Aslee Such, Amadeo Garnet, MD                                                                                                                                    Allergies Penicillins  Review of Systems Review of Systems  Constitutional:  Negative for fever.  HENT:  Negative for drooling and mouth sores.    As noted in HPI  Physical Exam Vital Signs  I have reviewed the triage vital signs BP 125/82 (BP Location: Right Arm)    Pulse 89   Temp 97.7 F (36.5 C) (Oral)   Resp 18   SpO2 97%   Physical Exam Vitals reviewed.  Constitutional:      General: He is not in acute distress.    Appearance: He is well-developed. He is not diaphoretic.  HENT:     Head: Normocephalic and atraumatic.     Right Ear: External ear normal.     Left Ear: External ear normal.     Nose: Nose normal.     Mouth/Throat:     Mouth: Mucous membranes are moist. No oral lesions.     Dentition: Abnormal dentition. Dental abscesses present.     Pharynx: Oropharynx is clear.     Tonsils: No tonsillar exudate.  Eyes:     General: No scleral icterus.    Conjunctiva/sclera: Conjunctivae normal.  Neck:     Trachea: Phonation normal.  Cardiovascular:     Rate and Rhythm: Normal rate and regular rhythm.  Pulmonary:     Effort: Pulmonary effort is normal. No respiratory distress.     Breath sounds: No stridor.  Abdominal:     General: There is no distension.  Musculoskeletal:        General: Normal range of motion.     Cervical back: Normal range of motion.  Neurological:     Mental Status: He is alert and oriented to person, place, and time.  Psychiatric:        Behavior: Behavior normal.     ED Results and Treatments Labs (all labs ordered are listed, but only abnormal results are displayed) Labs Reviewed - No data to display                                                                                                                       EKG  EKG Interpretation Date/Time:    Ventricular Rate:    PR Interval:    QRS Duration:    QT Interval:    QTC Calculation:   R Axis:      Text Interpretation:         Radiology No results found.  Medications Ordered in ED Medications  amoxicillin-clavulanate (AUGMENTIN) 875-125 MG per tablet 1 tablet (1 tablet Oral Given 03/09/23 0513)   Procedures .Marland KitchenIncision and Drainage  Date/Time: 03/09/2023 7:17 AM  Performed by: Nira Conn, MD Authorized by: Nira Conn, MD   Consent:    Consent obtained:  Verbal   Consent given by:  Patient   Risks discussed:  Bleeding, incomplete drainage and infection   Alternatives discussed:  Delayed treatment Universal protocol:    Patient identity confirmed:  Arm band Location:    Type:  Abscess   Location:  Mouth   Mouth location:  Alveolar process Anesthesia:    Anesthesia method:  Nerve block   Block needle gauge:  27 G   Block anesthetic:  Bupivacaine 0.5% WITH epi   Block technique:  Inferior alveolar   Block injection procedure:  Anatomic landmarks identified, introduced needle, incremental injection, negative aspiration for blood and anatomic landmarks palpated   Block outcome:  Anesthesia achieved Procedure type:    Complexity:  Simple Procedure details:    Needle aspiration: yes     Needle size:  18 G   Drainage:  Purulent   (including critical care time) Medical Decision Making / ED Course   Medical Decision Making   DDX considered listed below.  Dental abscess. No ludwigs or other deep tissue infection noted requiring imaging. Aspirated approx 1.5 cc of purulence. Augmentin given and Rx'd     Final Clinical Impression(s) / ED Diagnoses Final diagnoses:  Dental abscess   The patient appears reasonably screened and/or stabilized for discharge and I doubt any other medical condition or other [facility] requiring further screening, evaluation, or treatment in the ED at this time. I have discussed the findings, Dx and  Tx plan with the patient/family who expressed understanding and agree(s) with the plan. Discharge instructions discussed at length. The patient/family was given strict return precautions who verbalized understanding of the instructions. No further questions at time of discharge.  Disposition: Discharge  Condition: Good  ED Discharge Orders          Ordered    naproxen (NAPROSYN) 375 MG tablet  2 times daily with meals        03/09/23 0504     amoxicillin-clavulanate (AUGMENTIN) 875-125 MG tablet  Every 12 hours        03/09/23 0504             Follow Up: Dentist       This chart was dictated using voice recognition software.  Despite best efforts to proofread,  errors can occur which can change the documentation meaning.    Nira Conn, MD 03/09/23 [phone]
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
## 2023-12-21 ENCOUNTER — Encounter (HOSPITAL_BASED_OUTPATIENT_CLINIC_OR_DEPARTMENT_OTHER): Payer: Self-pay | Admitting: Emergency Medicine

## 2023-12-21 ENCOUNTER — Other Ambulatory Visit: Payer: Self-pay

## 2023-12-21 ENCOUNTER — Emergency Department (HOSPITAL_BASED_OUTPATIENT_CLINIC_OR_DEPARTMENT_OTHER)
Admission: EM | Admit: 2023-12-21 | Discharge: 2023-12-21 | Disposition: A | Discharge status: Home / Self Care | Payer: Self-pay | Attending: Emergency Medicine | Admitting: Emergency Medicine

## 2023-12-21 DIAGNOSIS — Z202 Contact with and (suspected) exposure to infections with a predominantly sexual mode of transmission: Secondary | ICD-10-CM | POA: Insufficient documentation

## 2023-12-21 LAB — HIV ANTIBODY (ROUTINE TESTING W REFLEX): HIV Screen 4th Generation wRfx: NONREACTIVE

## 2023-12-21 MED ORDER — LIDOCAINE HCL (PF) 1 % IJ SOLN
1.0000 mL | Freq: Once | INTRAMUSCULAR | Status: AC
  Administered 2023-12-21: 1 mL
  Filled 2023-12-21: qty 5

## 2023-12-21 MED ORDER — CEFTRIAXONE SODIUM 500 MG IJ SOLR
500.0000 mg | Freq: Once | INTRAMUSCULAR | Status: AC
  Administered 2023-12-21: 500 mg via INTRAMUSCULAR
  Filled 2023-12-21: qty 500

## 2023-12-21 MED ORDER — AZITHROMYCIN 250 MG PO TABS
1000.0000 mg | ORAL_TABLET | Freq: Once | ORAL | Status: AC
  Administered 2023-12-21: 1000 mg via ORAL
  Filled 2023-12-21: qty 4

## 2023-12-21 NOTE — Discharge Instructions (Addendum)
 You are tested for STDs here in the emergency department.  We treated you as if you were positive.  Your results will crossover to MyChart.  You can follow-up there typically takes about 2 days to come back.  If your HIV or syphilis test is positive you will need further outpatient follow-up.  You will need to use protection for 10 days after treatment for you and your partner.

## 2023-12-21 NOTE — ED Triage Notes (Signed)
 Pt reports his SO just let him know she was positive for gonorrhea.

## 2023-12-21 NOTE — ED Provider Notes (Signed)
 [place] EMERGENCY DEPARTMENT AT [facility] Provider Note   CSN: [id number] Arrival date & time: 12/21/23  1610     History  Chief Complaint  Patient presents with   Exposure to STD    Michael Sawyer. is a 38 y.o. male here for STD check.  He denies any symptoms.  Significant other just called him and said you tested positive for gonorrhea.  He would like treatment for "everything" and testing for everything as well.  No rashes, lesions, discharge, dysuria or fever.  HPI     Home Medications Prior to Admission medications   Medication Sig Start Date End Date Taking? Authorizing Provider  ibuprofen  (ADVIL ) 600 MG tablet Take 1 tablet (600 mg total) by mouth every 6 (six) hours as needed. 05/19/22   Mordecai Applebaum, MD  meclizine  (ANTIVERT ) 25 MG tablet Take 1 tablet (25 mg total) by mouth 3 (three) times daily as needed for dizziness. 04/26/22   Harris, Abigail, PA-C  naproxen  (NAPROSYN ) 375 MG tablet Take 1 tablet (375 mg total) by mouth 2 (two) times daily with a meal. 03/09/23   Cardama, Camila Cecil, MD      Allergies    Penicillins    Review of Systems   Review of Systems  Constitutional: Negative.   HENT: Negative.    Respiratory: Negative.    Cardiovascular: Negative.   Gastrointestinal: Negative.   Genitourinary: Negative.   Musculoskeletal: Negative.   Skin: Negative.   Neurological: Negative.   All other systems reviewed and are negative.   Physical Exam Updated Vital Signs BP 124/83   Pulse (!) 102   Temp 98.1 F (36.7 C) (Oral)   Resp 16   SpO2 99%  Physical Exam Vitals and nursing note reviewed.  Constitutional:      General: He is not in acute distress.    Appearance: He is well-developed. He is not ill-appearing, toxic-appearing or diaphoretic.  HENT:     Head: Atraumatic.  Eyes:     Pupils: Pupils are equal, round, and reactive to light.  Cardiovascular:     Rate and Rhythm: Normal rate and regular rhythm.     Pulses:  Normal pulses.     Heart sounds: Normal heart sounds.  Pulmonary:     Effort: Pulmonary effort is normal. No respiratory distress.  Abdominal:     General: There is no distension.     Palpations: Abdomen is soft.  Genitourinary:    Comments: declined Musculoskeletal:        General: Normal range of motion.     Cervical back: Normal range of motion and neck supple.  Skin:    General: Skin is warm and dry.  Neurological:     General: No focal deficit present.     Mental Status: He is alert and oriented to person, place, and time.    ED Results / Procedures / Treatments   Labs (all labs ordered are listed, but only abnormal results are displayed) Labs Reviewed  RPR  HIV ANTIBODY (ROUTINE TESTING W REFLEX)  GC/CHLAMYDIA PROBE AMP ([facility]) NOT AT [facility]    EKG None  Radiology No results found.  Procedures Procedures    Medications Ordered in ED Medications  cefTRIAXone  (ROCEPHIN ) injection 500 mg (has no administration in time range)  lidocaine  (PF) (XYLOCAINE ) 1 % injection 1-2.1 mL (has no administration in time range)  azithromycin  (ZITHROMAX ) tablet 1,000 mg (has no administration in time range)   ED Course/ Medical Decision Making/ A&P  38 year old here for evaluation of STD exposure.  Known exposure to gonorrhea.  He is currently asymptomatic.  Will treat as if he is positive for GC and chlamydia.  He wiill follow-up with results on MyChart.  The patient has been appropriately medically screened and/or stabilized in the ED. I have low suspicion for any other emergent medical condition which would require further screening, evaluation or treatment in the ED or require inpatient management.  Patient is hemodynamically stable and in no acute distress.  Patient able to ambulate in department prior to ED.  Evaluation does not show acute pathology that would require ongoing or additional emergent interventions while in the emergency department or further inpatient  treatment.  I have discussed the diagnosis with the patient and answered all questions.  Pain is been managed while in the emergency department and patient has no further complaints prior to discharge.  Patient is comfortable with plan discussed in room and is stable for discharge at this time.  I have discussed strict return precautions for returning to the emergency department.  Patient was encouraged to follow-up with PCP/specialist refer to at discharge.                                 Medical Decision Making Amount and/or Complexity of Data Reviewed External Data Reviewed: labs and notes. Labs: ordered. Decision-making details documented in ED Course.  Risk OTC drugs. Prescription drug management. Decision regarding hospitalization. Diagnosis or treatment significantly limited by social determinants of health.           Final Clinical Impression(s) / ED Diagnoses Final diagnoses:  STD exposure    Rx / DC Orders ED Discharge Orders     None         Trinia Georgi A, PA-C 12/21/23 1638    Horton, Sidra Dredge, DO 12/22/23 0015

## 2023-12-22 LAB — RPR: RPR Ser Ql: NONREACTIVE

## 2023-12-24 LAB — GC/CHLAMYDIA PROBE AMP (~~LOC~~) NOT AT ARMC
Chlamydia: NEGATIVE
Comment: NEGATIVE
Comment: NORMAL
Neisseria Gonorrhea: NEGATIVE
